# Patient Record
Sex: Male | Born: 1950 | Race: Black or African American | Hispanic: No | Marital: Single | Smoking: Former smoker
Health system: Southern US, Community
[De-identification: ages and names within clinical notes are randomized; demographics above are authoritative.]

## PROBLEM LIST (undated history)

## (undated) DIAGNOSIS — H269 Unspecified cataract: Secondary | ICD-10-CM

## (undated) DIAGNOSIS — T7840XA Allergy, unspecified, initial encounter: Secondary | ICD-10-CM

## (undated) DIAGNOSIS — M1990 Unspecified osteoarthritis, unspecified site: Secondary | ICD-10-CM

## (undated) DIAGNOSIS — K219 Gastro-esophageal reflux disease without esophagitis: Secondary | ICD-10-CM

## (undated) DIAGNOSIS — A159 Respiratory tuberculosis unspecified: Secondary | ICD-10-CM

## (undated) HISTORY — DX: Unspecified osteoarthritis, unspecified site: M19.90

## (undated) HISTORY — DX: Gastro-esophageal reflux disease without esophagitis: K21.9

## (undated) HISTORY — DX: Allergy, unspecified, initial encounter: T78.40XA

## (undated) HISTORY — PX: POLYPECTOMY: SHX149

## (undated) HISTORY — PX: OTHER SURGICAL HISTORY: SHX169

## (undated) HISTORY — DX: Unspecified cataract: H26.9

## (undated) HISTORY — DX: Respiratory tuberculosis unspecified: A15.9

## (undated) HISTORY — PX: COLONOSCOPY: SHX174

---

## 1979-07-22 DIAGNOSIS — A159 Respiratory tuberculosis unspecified: Secondary | ICD-10-CM

## 1979-07-22 HISTORY — DX: Respiratory tuberculosis unspecified: A15.9

## 1994-07-21 HISTORY — PX: OTHER SURGICAL HISTORY: SHX169

## 2002-06-11 ENCOUNTER — Emergency Department (HOSPITAL_COMMUNITY): Admission: EM | Admit: 2002-06-11 | Discharge: 2002-06-11 | Payer: Self-pay | Admitting: Emergency Medicine

## 2002-07-31 ENCOUNTER — Emergency Department (HOSPITAL_COMMUNITY): Admission: EM | Admit: 2002-07-31 | Discharge: 2002-07-31 | Payer: Self-pay

## 2005-12-11 ENCOUNTER — Encounter: Admission: RE | Admit: 2005-12-11 | Discharge: 2005-12-11 | Payer: Self-pay | Admitting: Emergency Medicine

## 2006-11-15 ENCOUNTER — Emergency Department (HOSPITAL_COMMUNITY): Admission: EM | Admit: 2006-11-15 | Discharge: 2006-11-15 | Payer: Self-pay | Admitting: Emergency Medicine

## 2006-11-16 ENCOUNTER — Emergency Department (HOSPITAL_COMMUNITY): Admission: EM | Admit: 2006-11-16 | Discharge: 2006-11-16 | Payer: Self-pay | Admitting: Emergency Medicine

## 2007-03-17 ENCOUNTER — Encounter: Admission: RE | Admit: 2007-03-17 | Discharge: 2007-03-17 | Payer: Self-pay | Admitting: Emergency Medicine

## 2007-04-02 ENCOUNTER — Encounter: Admission: RE | Admit: 2007-04-02 | Discharge: 2007-04-02 | Payer: Self-pay | Admitting: Emergency Medicine

## 2007-10-27 ENCOUNTER — Ambulatory Visit: Payer: Self-pay | Admitting: Internal Medicine

## 2007-11-09 ENCOUNTER — Ambulatory Visit: Payer: Self-pay | Admitting: Internal Medicine

## 2007-11-09 ENCOUNTER — Encounter: Payer: Self-pay | Admitting: Internal Medicine

## 2009-01-25 IMAGING — US US ABDOMEN COMPLETE
1 series · 14 of 25 positions shown · non-contrast
Comparison: None.

ABDOMEN ULTRASOUND:

CLINICAL DATA: Upper abdominal pain
TECHNIQUE: Complete abdominal ultrasound examination was performed including
evaluation of the liver, gallbladder, bile ducts, pancreas, kidneys, spleen,
IVC, and abdominal aorta.

[Series 1: us abdomen complete · 0.33mm/px · 14 of 62 slices shown]
[im 1/62]
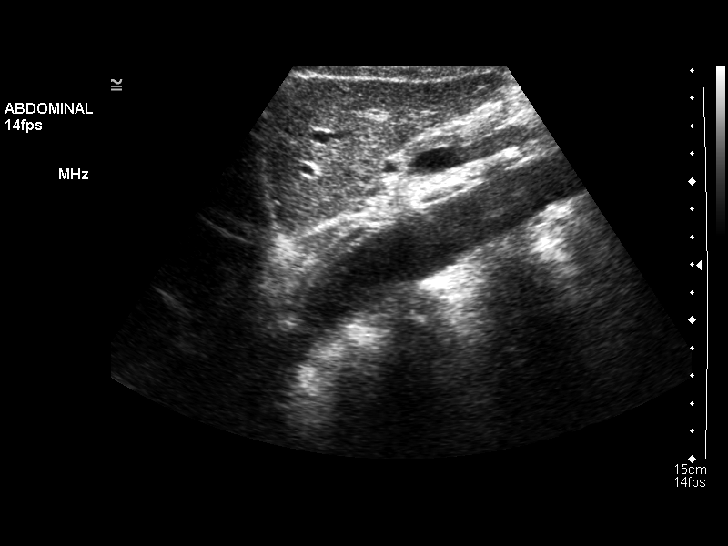
[im 6/62]
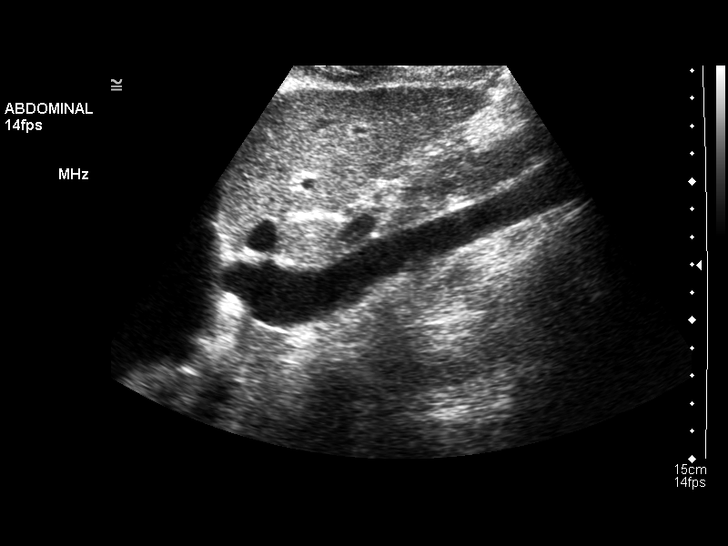
[im 11/62]
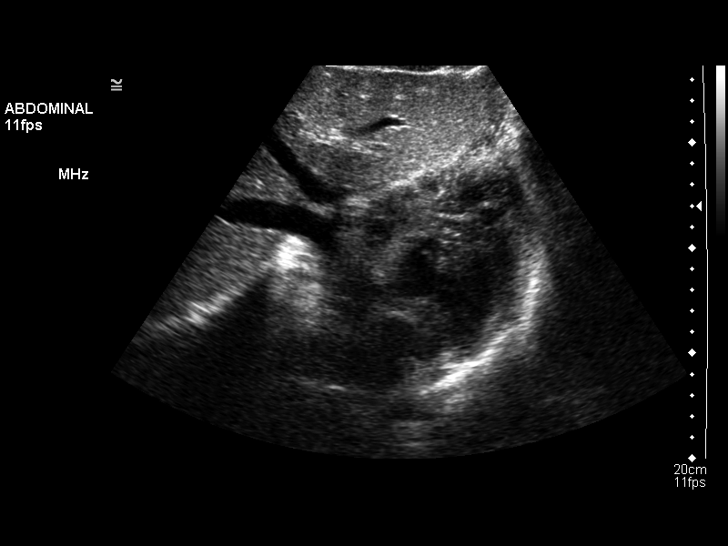
[im 16/62]
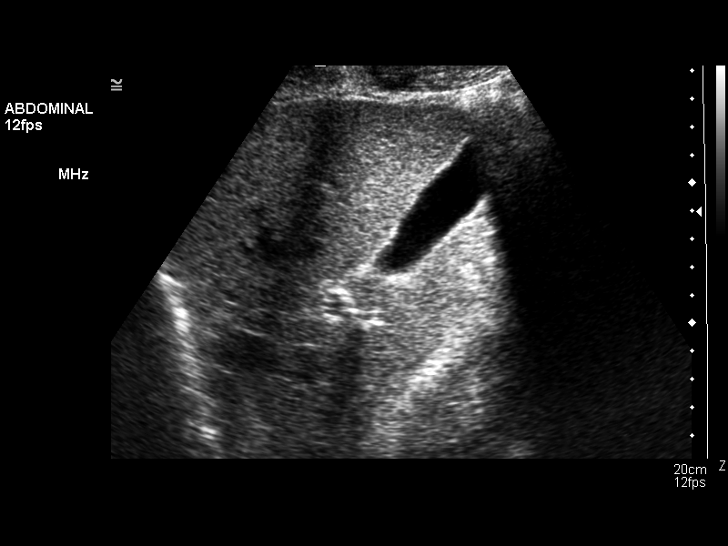
[im 21/62]
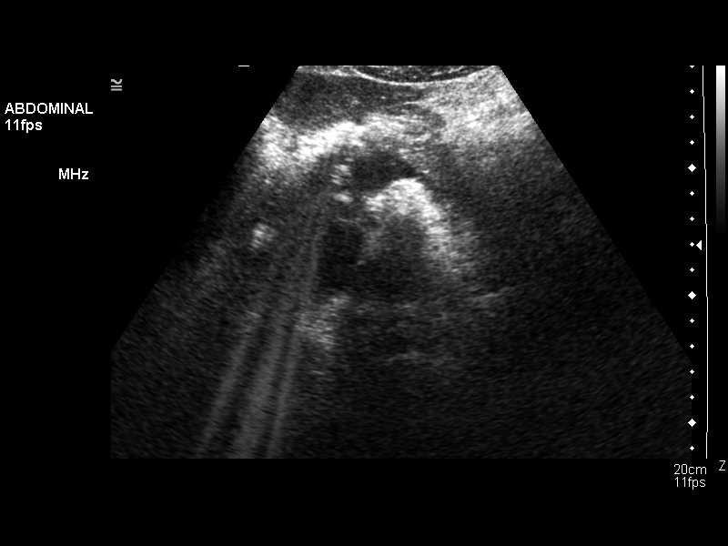
[im 23/62]
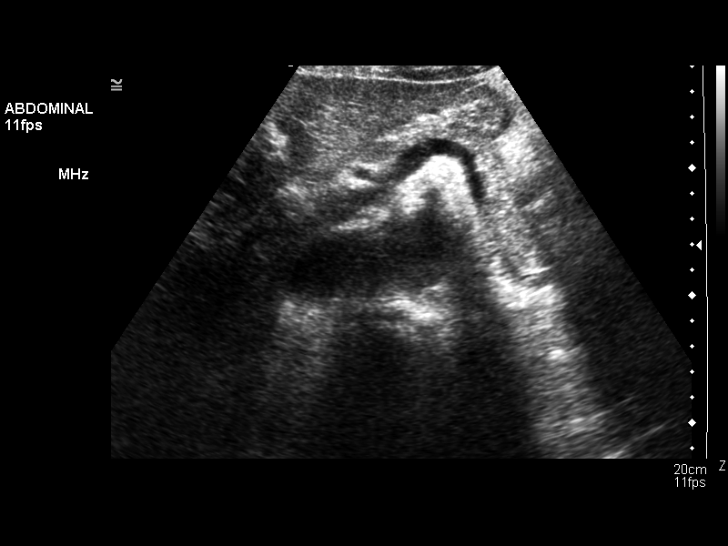
[im 28/62]
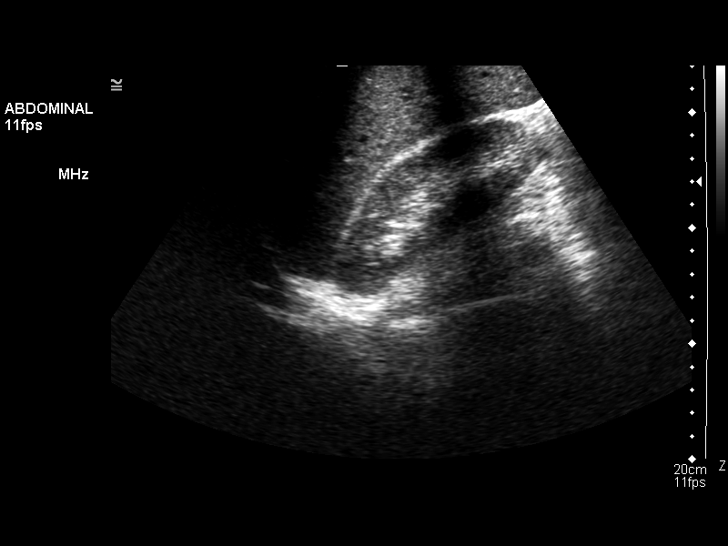
[im 34/62]
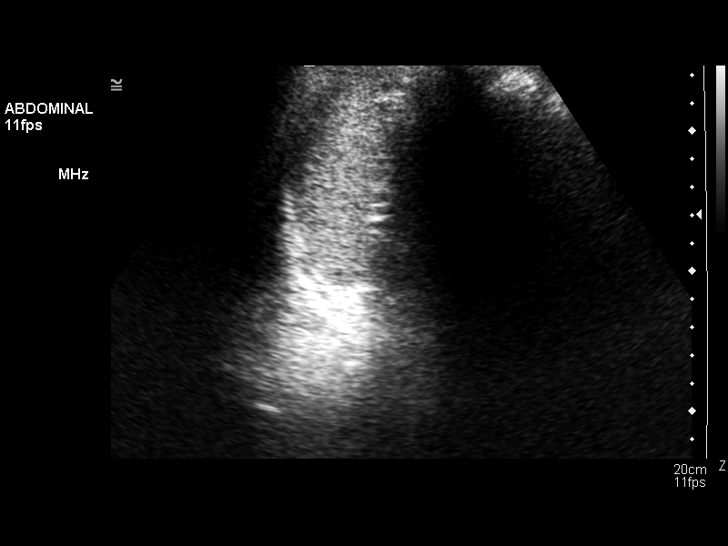
[im 39/62]
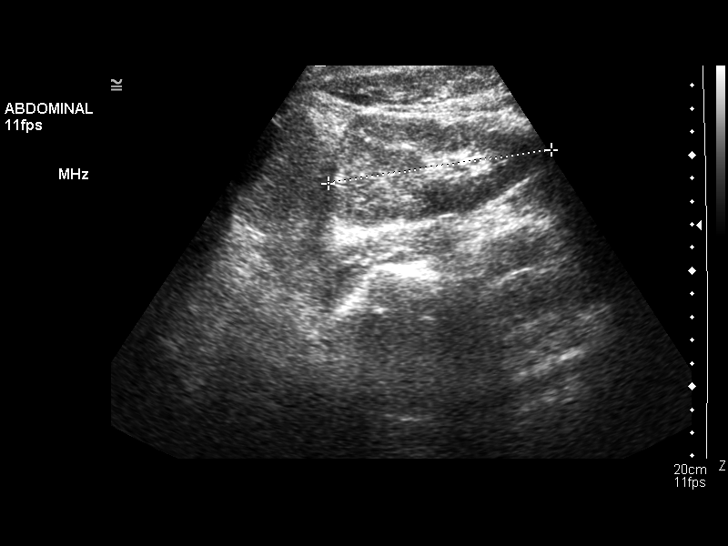
[im 41/62]
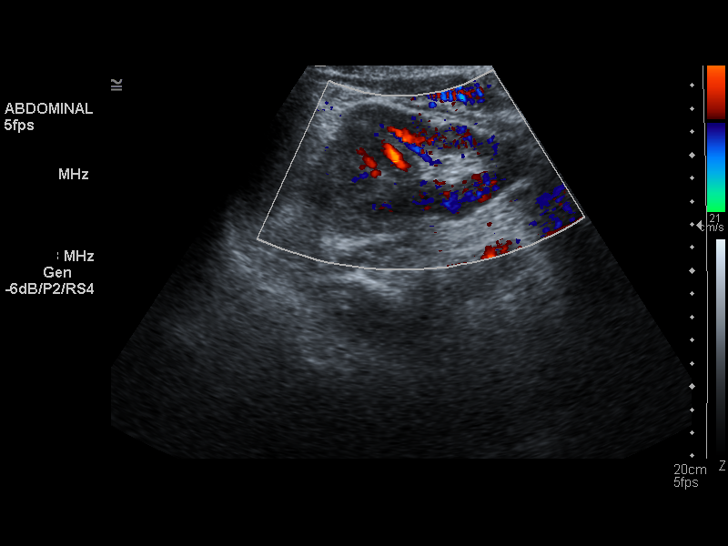
[im 46/62]
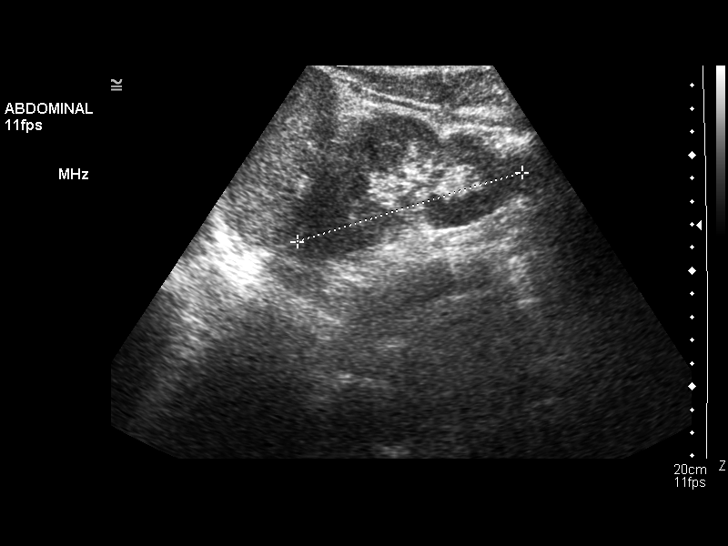
[im 51/62]
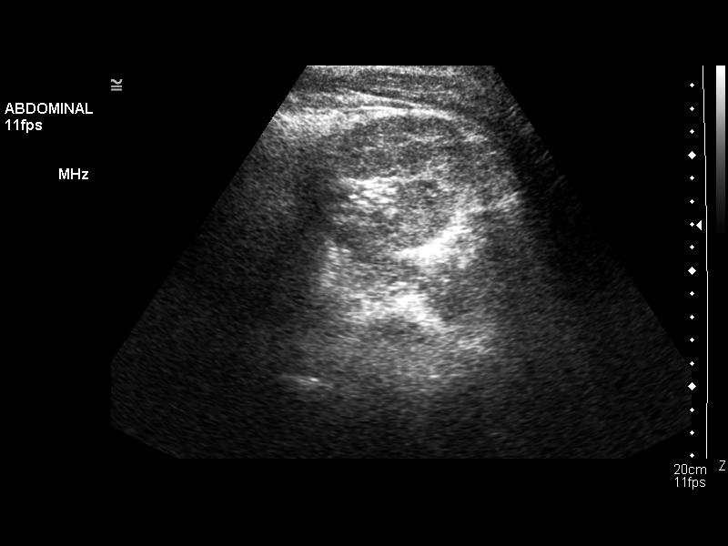
[im 56/62]
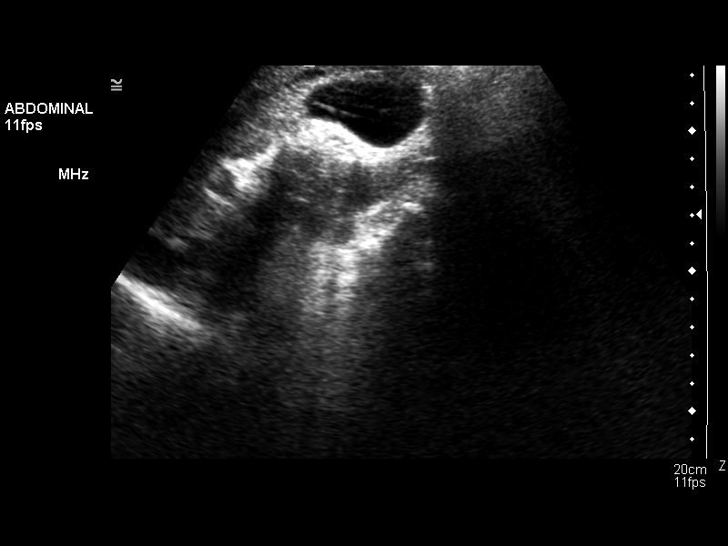
[im 62/62]
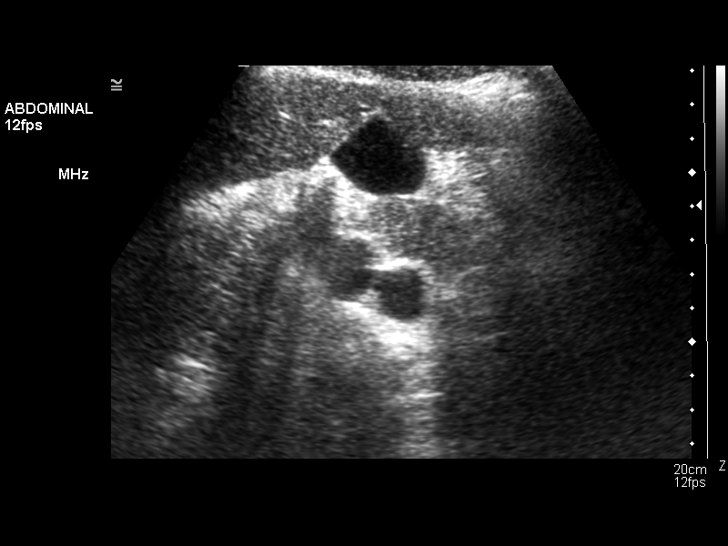

[14 of 25 positions shown; findings below may reference images not displayed]

FINDINGS: Gallbladder: Normal. Specifically, there is no evidence for gallstones,
gallbladder wall thickening or pericholecystic fluid.

Common Bile Duct:  Nondilated

Liver:  Normal

Inferior Vena Cava:  Normal

Pancreas:  Normal

Spleen:  Normal

Right Kidney:  11.0 cm in long axis.  Normal

Left Kidney:  10.2 cm in long axis.  Normal

Aorta:  No aneurysm
IMPRESSION: Normal abdominal ultrasound.

## 2009-02-10 IMAGING — RF DG UGI W/ HIGH DENSITY W/KUB
12 series · 12 of 12 positions shown · non-contrast
Comparison: none

CLINICAL DATA: Epigastric pain.
 UPPER GI WITH HIGH DENSITY WITH KUB:

[Series 1: run · 1 of 1 slices shown (1 of 11)]
[im 1/1]
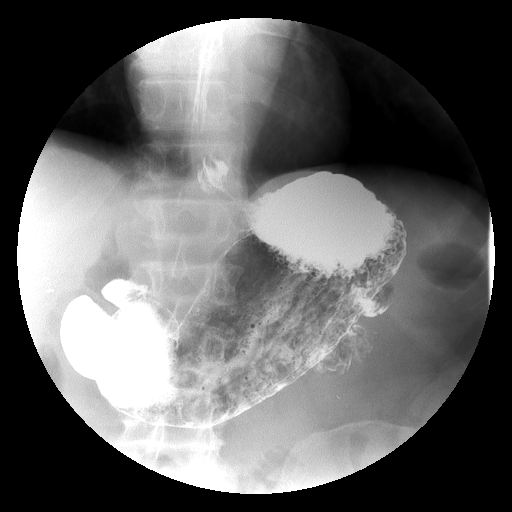

[Series 2: run · 1 of 1 slices shown (2 of 11)]
[im 1/1]
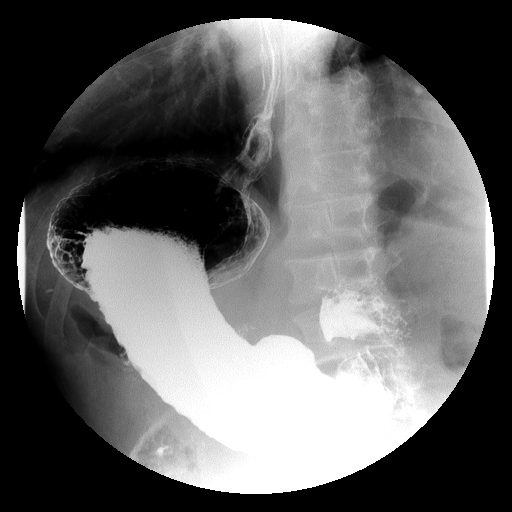

[Series 3: run · 1 of 1 slices shown (3 of 11)]
[im 1/1]
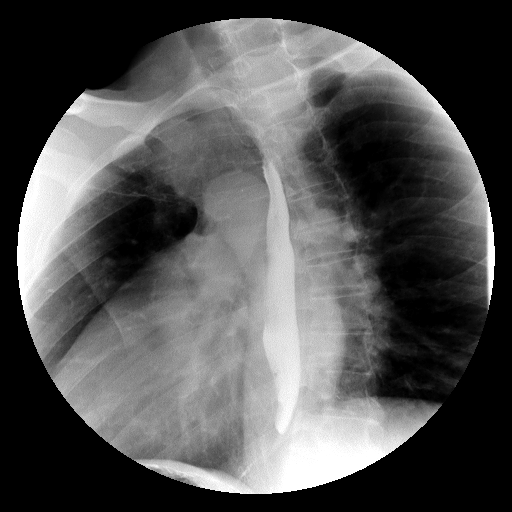

[Series 4: run · 1 of 1 slices shown (4 of 11)]
[im 1/1]
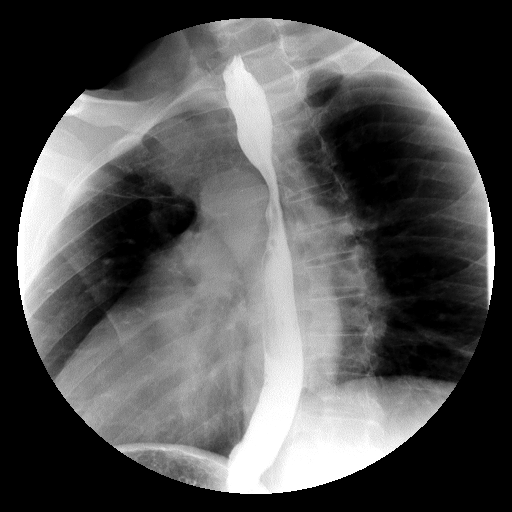

[Series 5: run · 1 of 1 slices shown (5 of 11)]
[im 1/1]
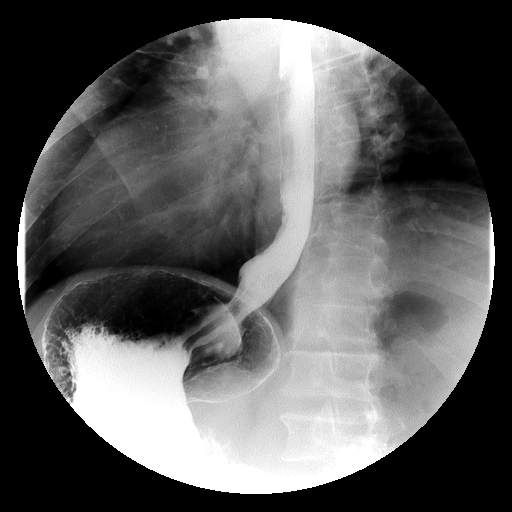

[Series 6: run · 1 of 1 slices shown (6 of 11)]
[im 1/1]
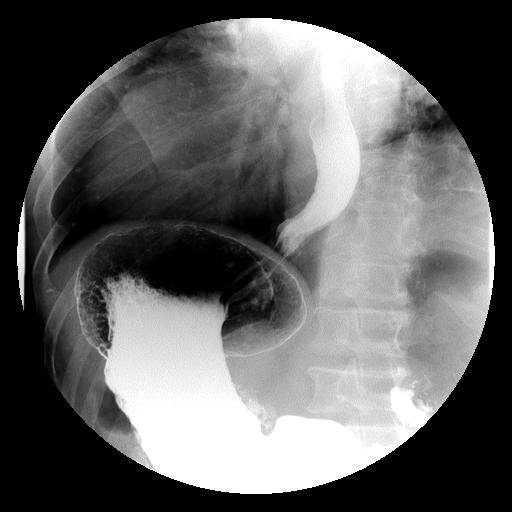

[Series 7: run · 1 of 1 slices shown (7 of 11)]
[im 1/1]
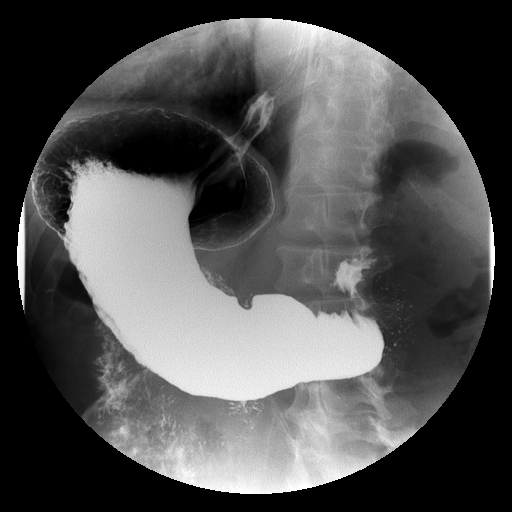

[Series 8: run · 1 of 1 slices shown (8 of 11)]
[im 1/1]
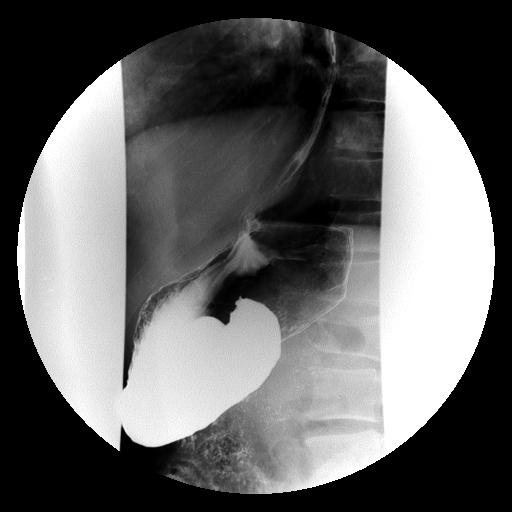

[Series 9: run · 1 of 1 slices shown (9 of 11)]
[im 1/1]
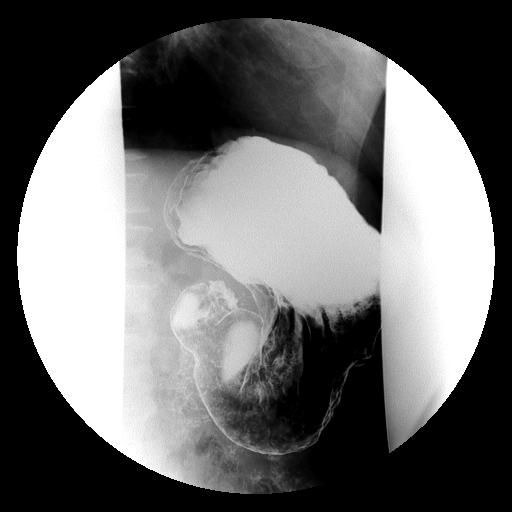

[Series 10: run · 1 of 1 slices shown (10 of 11)]
[im 1/1]
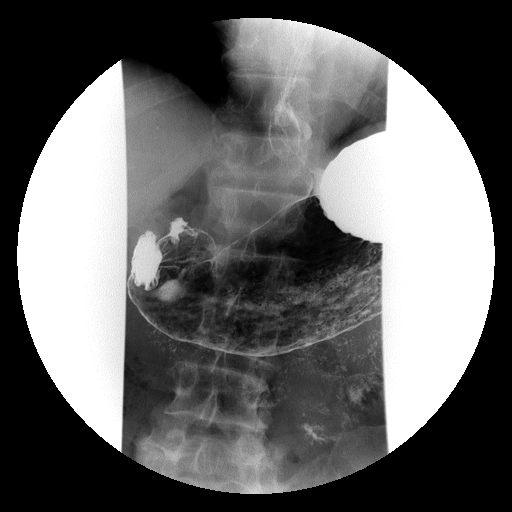

[Series 11: run · 1 of 1 slices shown (11 of 11)]
[im 1/1]
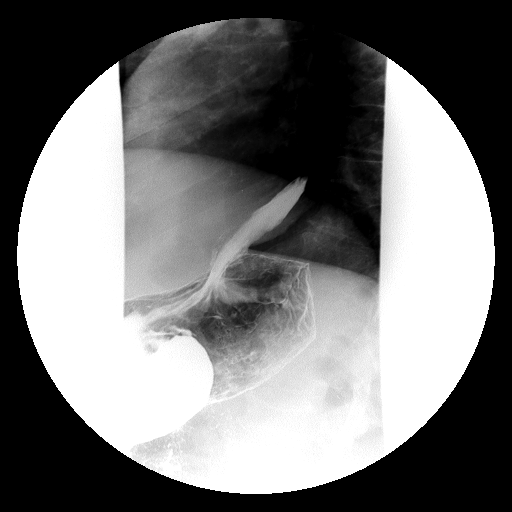

[Series 1001: view not recorded · 0.20mm/px · 1 of 1 slices shown]
[im 1/1]
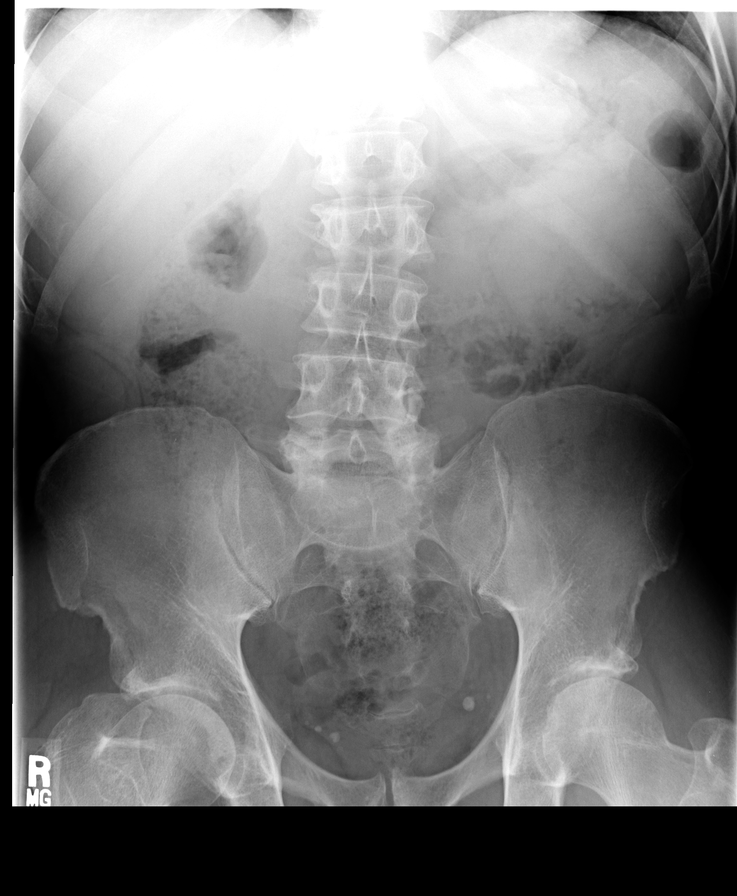

[12 of 12 positions shown; findings below may reference images not displayed]

FINDINGS: Preliminary scout film is negative.  Normal esophageal motility.  No stricture.  Minimal hiatal hernia.  Mild to moderate GE reflux is noted at fluoroscopy.  No gastric or duodenal mass, ulceration or mucosal fold thickening.
IMPRESSION: Small hiatal hernia and GE reflux.

## 2010-04-12 ENCOUNTER — Emergency Department (HOSPITAL_COMMUNITY): Admission: EM | Admit: 2010-04-12 | Discharge: 2010-04-12 | Payer: Self-pay | Admitting: Emergency Medicine

## 2010-05-14 ENCOUNTER — Emergency Department (HOSPITAL_COMMUNITY): Admission: EM | Admit: 2010-05-14 | Discharge: 2010-05-14 | Payer: Self-pay | Admitting: Emergency Medicine

## 2010-08-12 ENCOUNTER — Encounter: Payer: Self-pay | Admitting: Emergency Medicine

## 2012-12-02 ENCOUNTER — Encounter: Payer: Self-pay | Admitting: Internal Medicine

## 2013-12-28 ENCOUNTER — Encounter: Payer: Self-pay | Admitting: Internal Medicine

## 2014-04-22 ENCOUNTER — Encounter: Payer: Self-pay | Admitting: Internal Medicine

## 2015-08-29 ENCOUNTER — Encounter (HOSPITAL_COMMUNITY): Payer: Self-pay | Admitting: Emergency Medicine

## 2015-08-29 ENCOUNTER — Emergency Department (INDEPENDENT_AMBULATORY_CARE_PROVIDER_SITE_OTHER)
Admission: EM | Admit: 2015-08-29 | Discharge: 2015-08-29 | Disposition: A | Discharge status: Home / Self Care | Payer: PRIVATE HEALTH INSURANCE | Source: Home / Self Care | Attending: Emergency Medicine | Admitting: Emergency Medicine

## 2015-08-29 DIAGNOSIS — L0231 Cutaneous abscess of buttock: Secondary | ICD-10-CM | POA: Diagnosis not present

## 2015-08-29 MED ORDER — LIDOCAINE HCL 2 % IJ SOLN
INTRAMUSCULAR | Status: AC
  Filled 2015-08-29: qty 20

## 2015-08-29 MED ORDER — HYDROCODONE-ACETAMINOPHEN 5-325 MG PO TABS
1.0000 | ORAL_TABLET | ORAL | Status: DC | PRN
Start: 2015-08-29 — End: 2017-03-05

## 2015-08-29 NOTE — ED Notes (Signed)
History of boils.  Patient reports abscess to left buttocks.  Onset 4 days ago.

## 2015-08-29 NOTE — ED Provider Notes (Signed)
CSN: [id number]     Arrival date & time 08/29/15  1301 History   First MD Initiated Contact with Patient 08/29/15 1317     Chief Complaint  Patient presents with  . Abscess   (Consider location/radiation/quality/duration/timing/severity/associated sxs/prior Treatment) HPI Comments: 65 year old male complaining of a boil to his left buttock. Started approximately 3 days ago. He has noticed it is been draining. 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 Denies fevers or systemic symptoms.   History reviewed. No pertinent past medical history. Past Surgical History  Procedure Laterality Date  . Cataracts     No family history on file. Social History  Substance Use Topics  . Smoking status: Never Smoker   . Smokeless tobacco: None  . Alcohol Use: No    Review of Systems  Constitutional: Negative.   Skin: Positive for wound.       Abscess to the left medial buttock as per history of present illness  All other systems reviewed and are negative.   Allergies  Review of patient's allergies indicates no known allergies.  Home Medications   Prior to Admission medications   Medication Sig Start Date End Date Taking? Authorizing Provider  HYDROcodone-acetaminophen (NORCO/VICODIN) 5-325 MG tablet Take 1 tablet by mouth every 4 (four) hours as needed. 08/29/15   Janne Napoleon, NP   Meds Ordered and Administered this Visit  Medications - No data to display  BP 119/81 mmHg  Pulse 107  Temp(Src) 97.8 F (36.6 C) (Oral)  Resp 16  SpO2 98% No data found.   Physical Exam  Constitutional: He appears well-developed and well-nourished. No distress.  Neck: Normal range of motion. Neck supple.  Cardiovascular: Normal rate.   Pulmonary/Chest: Effort normal. No respiratory distress.  Neurological: He is alert. No cranial nerve deficit. He exhibits normal muscle tone.  Skin: Skin is warm and dry.  Draining abscess to the right medial buttock  near the perineum. Does not include the anus. There is purulent drainage. Positive for tenderness and induration.  Psychiatric: He has a normal mood and affect.  Nursing note and vitals reviewed.   ED Course  .Marland KitchenIncision and Drainage Date/Time: 08/29/2015 2:05 PM Performed by: Marcha Dutton, Aadyn Buchheit Authorized by: Melony Overly Consent: Verbal consent obtained. Risks and benefits: risks, benefits and alternatives were discussed Consent given by: patient Patient understanding: patient states understanding of the procedure being performed Patient identity confirmed: verbally with patient Type: abscess Body area: lower extremity Location details: right buttock Anesthesia: local infiltration Local anesthetic: lidocaine 2% without epinephrine Anesthetic total: 7 ml Patient sedated: no Scalpel size: 11 Incision type: single with marsupialization Incision depth: subcutaneous Complexity: simple Drainage: purulent and  bloody Drainage amount: scant Wound treatment: drain placed Packing material: 1/4 in iodoform gauze Patient tolerance: Patient tolerated the procedure well with no immediate complications Comments: Bulky dressing applied.   (including critical care time)  Labs Review Labs Reviewed - No data to display  Imaging Review No results found.   Visual Acuity Review  Right Eye Distance:   Left Eye Distance:   Bilateral Distance:    Right Eye Near:   Left Eye Near:    Bilateral Near:         MDM   1. Abscess of buttock, right    incision and drainage performed. Warm compresses often Norco for pain, may cause drowsiness. Should not drive or operate dangerous machinery while taking Return 2 days for wound chk.  Janne Napoleon, NP 08/29/15 1407

## 2015-08-29 NOTE — Discharge Instructions (Signed)
Abscess Warm compresses often Norco for pain, may cause drowsiness. Should not drive or operate dangerous machinery while taking An abscess is an infected area that contains a collection of pus and debris.It can occur in almost any part of the body. An abscess is also known as a furuncle or boil. CAUSES  An abscess occurs when tissue gets infected. This can occur from blockage of oil or sweat glands, infection of hair follicles, or a minor injury to the skin. As the body tries to fight the infection, pus collects in the area and creates pressure under the skin. This pressure causes pain. People with weakened immune systems have difficulty fighting infections and get certain abscesses more often.  SYMPTOMS Usually an abscess develops on the skin and becomes a painful mass that is red, warm, and tender. If the abscess forms under the skin, you may feel a moveable soft area under the skin. Some abscesses break open (rupture) on their own, but most will continue to get worse without care. The infection can spread deeper into the body and eventually into the bloodstream, causing you to feel ill.  DIAGNOSIS  Your caregiver will take your medical history and perform a physical exam. A sample of fluid may also be taken from the abscess to determine what is causing your infection. TREATMENT  Your caregiver may prescribe antibiotic medicines to fight the infection. However, taking antibiotics alone usually does not cure an abscess. Your caregiver may need to make a small cut (incision) in the abscess to drain the pus. In some cases, gauze is packed into the abscess to reduce pain and to continue draining the area. HOME CARE INSTRUCTIONS   Only take over-the-counter or prescription medicines for pain, discomfort, or fever as directed by your caregiver.  If you were prescribed antibiotics, take them as directed. Finish them even if you start to feel better.  If gauze is used, follow your caregiver's  directions for changing the gauze.  To avoid spreading the infection:  Keep your draining abscess covered with a bandage.  Wash your hands well.  Do not share personal care items, towels, or whirlpools with others.  Avoid skin contact with others.  Keep your skin and clothes clean around the abscess.  Keep all follow-up appointments as directed by your caregiver. SEEK MEDICAL CARE IF:   You have increased pain, swelling, redness, fluid drainage, or bleeding.  You have muscle aches, chills, or a general ill feeling.  You have a fever. MAKE SURE YOU:   Understand these instructions.  Will watch your condition.  Will get help right away if you are not doing well or get worse.   This information is not intended to replace advice given to you by your health care provider. Make sure you discuss any questions you have with your health care provider.   Document Released: 04/16/2005 Document Revised: 01/06/2012 Document Reviewed: 09/19/2011 Elsevier Interactive Patient Education 2016 [employer]  Incision and Drainage Incision and drainage is a procedure in which a sac-like structure (cystic structure) is opened and drained. The area to be drained usually contains material such as pus, fluid, or blood.  LET YOUR CAREGIVER KNOW ABOUT:   Allergies to medicine.  Medicines taken, including vitamins, herbs, eyedrops, over-the-counter medicines, and creams.  Use of steroids (by mouth or creams).  Previous problems with anesthetics or numbing medicines.  History of bleeding problems or blood clots.  Previous surgery.  Other health problems, including diabetes and kidney problems.  Possibility of pregnancy,  if this applies. RISKS AND COMPLICATIONS  Pain.  Bleeding.  Scarring.  Infection. BEFORE THE PROCEDURE  You may need to have an ultrasound or other imaging tests to see how large or deep your cystic structure is. Blood tests may also be used to determine if you  have an infection or how severe the infection is. You may need to have a tetanus shot. PROCEDURE  The affected area is cleaned with a cleaning fluid. The cyst area will then be numbed with a medicine (local anesthetic). A small incision will be made in the cystic structure. A syringe or catheter may be used to drain the contents of the cystic structure, or the contents may be squeezed out. The area will then be flushed with a cleansing solution. After cleansing the area, it is often gently packed with a gauze or another wound dressing. Once it is packed, it will be covered with gauze and tape or some other type of wound dressing. AFTER THE PROCEDURE   Often, you will be allowed to go home right after the procedure.  You may be given antibiotic medicine to prevent or heal an infection.  If the area was packed with gauze or some other wound dressing, you will likely need to come back in 1 to 2 days to get it removed.  The area should heal in about 14 days.   This information is not intended to replace advice given to you by your health care provider. Make sure you discuss any questions you have with your health care provider.   Document Released: 12/31/2000 Document Revised: 01/06/2012 Document Reviewed: 09/01/2011 Elsevier Interactive Patient Education [employer].

## 2015-08-31 ENCOUNTER — Emergency Department (INDEPENDENT_AMBULATORY_CARE_PROVIDER_SITE_OTHER)
Admission: EM | Admit: 2015-08-31 | Discharge: 2015-08-31 | Disposition: A | Discharge status: Home / Self Care | Payer: PRIVATE HEALTH INSURANCE | Source: Home / Self Care | Attending: Emergency Medicine | Admitting: Emergency Medicine

## 2015-08-31 ENCOUNTER — Encounter (HOSPITAL_COMMUNITY): Payer: Self-pay | Admitting: Emergency Medicine

## 2015-08-31 DIAGNOSIS — Z5189 Encounter for other specified aftercare: Secondary | ICD-10-CM

## 2015-08-31 DIAGNOSIS — Z4801 Encounter for change or removal of surgical wound dressing: Secondary | ICD-10-CM

## 2015-08-31 MED ORDER — SULFAMETHOXAZOLE-TRIMETHOPRIM 800-160 MG PO TABS: 1.0000 | ORAL_TABLET | Freq: Two times a day (BID) | ORAL | Status: AC

## 2015-08-31 NOTE — ED Notes (Signed)
The patient presented to the [facility] with a complaint of a follow up on an abscess that was initially evaluated on 08/29/2015.

## 2015-08-31 NOTE — ED Provider Notes (Signed)
CSN: [id number]     Arrival date & time 08/31/15  1549 History   First MD Initiated Contact with Patient 08/31/15 1702     Chief Complaint  Patient presents with  . Follow-up  . Abscess   (Consider location/radiation/quality/duration/timing/severity/associated sxs/prior Treatment) HPI  He is a 65 year old man here for follow-up of abscess. He was seen here 2 days ago and a left buttock abscess was drained. Packing was placed. He has left the bandage in place. He states it is improving. No fevers or chills. He is not currently on antibiotics.  History reviewed. No pertinent past medical history. Past Surgical History  Procedure Laterality Date  . Cataracts     History reviewed. No pertinent family history. Social History  Substance Use Topics  . Smoking status: Never Smoker   . Smokeless tobacco: None  . Alcohol Use: No    Review of Systems As in history of present illness Allergies  Review of patient's allergies indicates no known allergies.  Home Medications   Prior to Admission medications   Medication Sig Start Date End Date Taking? Authorizing Provider  HYDROcodone-acetaminophen (NORCO/VICODIN) 5-325 MG tablet Take 1 tablet by mouth every 4 (four) hours as needed. 08/29/15  Yes Janne Napoleon, NP  sulfamethoxazole-trimethoprim (BACTRIM DS,SEPTRA DS) 800-160 MG tablet Take 1 tablet by mouth 2 (two) times daily. 08/31/15 09/07/15  Melony Overly, MD   Meds Ordered and Administered this Visit  Medications - No data to display  BP 138/70 mmHg  Pulse 71  Temp(Src) 97.8 F (36.6 C) (Oral)  Resp 16  SpO2 100% No data found.   Physical Exam  Constitutional: He is oriented to person, place, and time. He appears well-developed and well-nourished. No distress.  Cardiovascular: Normal rate.   Pulmonary/Chest: Effort normal.  Neurological: He is alert and oriented to person, place, and time.  Skin:  Healing abscess at the posterior aspect of the left scrotum. Very minimal drainage  seen today. There is some surrounding induration and tenderness.    ED Course  Procedures (including critical care time)  Labs Review Labs Reviewed - No data to display  Imaging Review No results found.    MDM   1. Wound check, abscess    No need to repack the wound. We'll treat with 7 days of Bactrim for the induration. Follow-up as needed.    Melony Overly, MD 08/31/15 [phone]

## 2015-08-31 NOTE — Discharge Instructions (Signed)
The abscess is healing well. You may see a small amount of drainage for the next day or 2. Please take Bactrim twice a day for the next 7 days. Follow-up as needed.

## 2017-01-01 ENCOUNTER — Encounter: Payer: Self-pay | Admitting: Internal Medicine

## 2017-02-25 ENCOUNTER — Ambulatory Visit (AMBULATORY_SURGERY_CENTER): Payer: Self-pay | Admitting: *Deleted

## 2017-02-25 VITALS — Ht 65.0 in | Wt 162.0 lb

## 2017-02-25 DIAGNOSIS — Z86010 Personal history of colonic polyps: Secondary | ICD-10-CM

## 2017-02-25 MED ORDER — NA SULFATE-K SULFATE-MG SULF 17.5-3.13-1.6 GM/177ML PO SOLN: 1.0000 | Freq: Once | ORAL | 0 refills | Status: AC

## 2017-02-25 NOTE — Progress Notes (Signed)
No egg or soy allergy known to patient  No issues with past sedation with any surgeries  or procedures, no intubation problems  No diet pills per patient No home 02 use per patient  No blood thinners per patient  Pt denies issues with constipation  No A fib or A flutter  EMMI video sent to pt's e mail  

## 2017-02-26 ENCOUNTER — Encounter: Payer: Self-pay | Admitting: Internal Medicine

## 2017-03-05 ENCOUNTER — Encounter: Payer: Self-pay | Admitting: Internal Medicine

## 2017-03-05 ENCOUNTER — Ambulatory Visit (AMBULATORY_SURGERY_CENTER): Payer: PRIVATE HEALTH INSURANCE | Admitting: Internal Medicine

## 2017-03-05 VITALS — BP 127/80 | HR 51 | Temp 98.7°F | Resp 14 | Ht 65.0 in | Wt 162.0 lb

## 2017-03-05 DIAGNOSIS — D122 Benign neoplasm of ascending colon: Secondary | ICD-10-CM

## 2017-03-05 DIAGNOSIS — Z86010 Personal history of colonic polyps: Secondary | ICD-10-CM

## 2017-03-05 DIAGNOSIS — D126 Benign neoplasm of colon, unspecified: Secondary | ICD-10-CM | POA: Diagnosis not present

## 2017-03-05 MED ORDER — SODIUM CHLORIDE 0.9 % IV SOLN: 500.0000 mL | INTRAVENOUS | Status: AC

## 2017-03-05 NOTE — Op Note (Signed)
[name] Procedure Date: 03/05/2017 3:05 PM MRN: [id number] Endoscopist: Docia Chuck. Henrene Pastor , MD Age: 66 Referring MD:  Date of Birth: 06-Jul-1951 Gender: Male Account #: [id number] Procedure:                Colonoscopy, with snare polypectomy x 1 Indications:              High risk colon cancer surveillance: Personal                            history of non-advanced adenoma. Prior examinations                            2004 and 2009 Medicines:                Monitored Anesthesia Care Procedure:                Pre-Anesthesia Assessment:                           - Prior to the procedure, a History and Physical                            was performed, and patient medications and                            allergies were reviewed. The patient's tolerance of                            previous anesthesia was also reviewed. The risks                            and benefits of the procedure and the sedation                            options and risks were discussed with the patient.                            All questions were answered, and informed consent                            was obtained. Prior Anticoagulants: The patient has                            taken no previous anticoagulant or antiplatelet                            agents. ASA Grade Assessment: II - A patient with                            mild systemic disease. After reviewing the risks                            and benefits, the patient was deemed in  satisfactory condition to undergo the procedure.                           After obtaining informed consent, the colonoscope                            was passed under direct vision. Throughout the                            procedure, the patient's blood pressure, pulse, and                            oxygen saturations were monitored continuously. The                            Model CF-HQ190L  [id number]) scope was introduced                            through the anus and advanced to the the cecum,                            identified by appendiceal orifice and ileocecal                            valve. The ileocecal valve, appendiceal orifice,                            and rectum were photographed. The quality of the                            bowel preparation was excellent. The colonoscopy                            was performed without difficulty. The patient                            tolerated the procedure well. The bowel preparation                            used was SUPREP. Scope In: 3:19:53 PM Scope Out: 3:32:46 PM Scope Withdrawal Time: 0 hours 9 minutes 28 seconds  Total Procedure Duration: 0 hours 12 minutes 53 seconds  Findings:                 A 1 mm polyp was found in the ascending colon. The                            polyp was removed with a cold snare. Resection and                            retrieval were complete.                           Internal hemorrhoids were found during retroflexion.  The exam was otherwise without abnormality on                            direct and retroflexion views. Complications:            No immediate complications. Estimated blood loss:                            None. Estimated Blood Loss:     Estimated blood loss: none. Impression:               - One 1 mm polyp in the ascending colon, removed                            with a cold snare. Resected and retrieved.                           - Internal hemorrhoids.                           - The examination was otherwise normal on direct                            and retroflexion views. Recommendation:           - Repeat colonoscopy in 5-10 years for surveillance.                           - Patient has a contact number available for                            emergencies. The signs and symptoms of potential                             delayed complications were discussed with the                            patient. Return to normal activities tomorrow.                            Written discharge instructions were provided to the                            patient.                           - Resume previous diet.                           - Continue present medications.                           - Await pathology results. Docia Chuck. Henrene Pastor, MD 03/05/2017 3:37:23 PM This report has been signed electronically.

## 2017-03-05 NOTE — Progress Notes (Signed)
Report to PACU, RN, vss, BBS= Clear.  

## 2017-03-05 NOTE — Progress Notes (Signed)
Called to room to assist during endoscopic procedure.  Patient ID and intended procedure confirmed with present staff. Received instructions for my participation in the procedure from the performing physician.  

## 2017-03-05 NOTE — Progress Notes (Signed)
Pt's states no medical or surgical changes since previsit or office visit. 

## 2017-03-05 NOTE — Patient Instructions (Signed)
YOU HAD AN ENDOSCOPIC PROCEDURE TODAY AT THE [facility]:   Refer to the procedure report that was given to you for any specific questions about what was found during the examination.  If the procedure report does not answer your questions, please call your gastroenterologist to clarify.  If you requested that your care partner not be given the details of your procedure findings, then the procedure report has been included in a sealed envelope for you to review at your convenience later.  YOU SHOULD EXPECT: Some feelings of bloating in the abdomen. Passage of more gas than usual.  Walking can help get rid of the air that was put into your GI tract during the procedure and reduce the bloating. If you had a lower endoscopy (such as a colonoscopy or flexible sigmoidoscopy) you may notice spotting of blood in your stool or on the toilet paper. If you underwent a bowel prep for your procedure, you may not have a normal bowel movement for a few days.  Please Note:  You might notice some irritation and congestion in your nose or some drainage.  This is from the oxygen used during your procedure.  There is no need for concern and it should clear up in a day or so.  SYMPTOMS TO REPORT IMMEDIATELY:   Following lower endoscopy (colonoscopy or flexible sigmoidoscopy):  Excessive amounts of blood in the stool  Significant tenderness or worsening of abdominal pains  Swelling of the abdomen that is new, acute  Fever of 100F or higher   For urgent or emergent issues, a gastroenterologist can be reached at any hour by calling [phone].   DIET:  We do recommend a small meal at first, but then you may proceed to your regular diet.  Drink plenty of fluids but you should avoid alcoholic beverages for 24 hours.  ACTIVITY:  You should plan to take it easy for the rest of today and you should NOT DRIVE or use heavy machinery until tomorrow (because of the sedation medicines used during the test).     FOLLOW UP: Our staff will call the number listed on your records the next business day following your procedure to check on you and address any questions or concerns that you may have regarding the information given to you following your procedure. If we do not reach you, we will leave a message.  However, if you are feeling well and you are not experiencing any problems, there is no need to return our call.  We will assume that you have returned to your regular daily activities without incident.  If any biopsies were taken you will be contacted by phone or by letter within the next 1-3 weeks.  Please call us at [phone] if you have not heard about the biopsies in 3 weeks.    SIGNATURES/CONFIDENTIALITY: You and/or your care partner have signed paperwork which will be entered into your electronic medical record.  These signatures attest to the fact that that the information above on your After Visit Summary has been reviewed and is understood.  Full responsibility of the confidentiality of this discharge information lies with you and/or your care-partner.    Handouts were given to your care partner on polyps and hemorrhoids. You may resume your current medications today. Await biopsy results. Please call if any questions or concerns.   

## 2017-03-06 ENCOUNTER — Telehealth: Payer: Self-pay | Admitting: *Deleted

## 2017-03-06 NOTE — Telephone Encounter (Signed)
  Follow up Call-  Call back number 03/05/2017  Post procedure Call Back phone  # [phone]  Permission to leave phone message Yes  Some recent data might be hidden     Patient questions:  Do you have a fever, pain , or abdominal swelling? No. Pain Score  0 *  Have you tolerated food without any problems? Yes.    Have you been able to return to your normal activities? Yes.    Do you have any questions about your discharge instructions: Diet   No. Medications  No. Follow up visit  No.  Do you have questions or concerns about your Care? No.  Actions: * If pain score is 4 or above: No action needed, pain <4.

## 2017-03-11 ENCOUNTER — Encounter: Payer: Self-pay | Admitting: Internal Medicine

## 2019-02-08 ENCOUNTER — Other Ambulatory Visit: Payer: Self-pay | Admitting: *Deleted

## 2019-02-08 DIAGNOSIS — Z20822 Contact with and (suspected) exposure to covid-19: Secondary | ICD-10-CM

## 2021-07-30 DIAGNOSIS — E7800 Pure hypercholesterolemia, unspecified: Secondary | ICD-10-CM | POA: Diagnosis not present

## 2021-07-30 DIAGNOSIS — R7301 Impaired fasting glucose: Secondary | ICD-10-CM | POA: Diagnosis not present

## 2021-07-30 DIAGNOSIS — Z125 Encounter for screening for malignant neoplasm of prostate: Secondary | ICD-10-CM | POA: Diagnosis not present

## 2021-08-05 DIAGNOSIS — Z1339 Encounter for screening examination for other mental health and behavioral disorders: Secondary | ICD-10-CM | POA: Diagnosis not present

## 2021-08-05 DIAGNOSIS — Z0000 Encounter for general adult medical examination without abnormal findings: Secondary | ICD-10-CM | POA: Diagnosis not present

## 2021-08-05 DIAGNOSIS — R7301 Impaired fasting glucose: Secondary | ICD-10-CM | POA: Diagnosis not present

## 2021-08-05 DIAGNOSIS — Z1331 Encounter for screening for depression: Secondary | ICD-10-CM | POA: Diagnosis not present

## 2022-02-26 ENCOUNTER — Ambulatory Visit: Payer: BC Managed Care – PPO | Admitting: Podiatry

## 2022-02-26 DIAGNOSIS — Z79899 Other long term (current) drug therapy: Secondary | ICD-10-CM

## 2022-02-26 DIAGNOSIS — B351 Tinea unguium: Secondary | ICD-10-CM

## 2022-02-26 NOTE — Progress Notes (Signed)
  Subjective:  Patient ID: Brandon Strong, male    DOB: March 25, 1951,  MRN: [id number]  Chief Complaint  Patient presents with   Nail Problem    71 y.o. male presents with the above complaint.  Patient presenting thickened elongated dystrophic mycotic toenails x10.  Mild pain on palpation.  Patient would like to discuss treatment options for it.  He is tried over-the-counter medication none of which has helped.  Pain scale is 1 out of 10 hurts with ambulation.   Review of Systems: Negative except as noted in the HPI. Denies N/V/F/Ch.  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    Current Outpatient Medications:    fluticasone (FLONASE) 50 MCG/ACT nasal spray, Place 1 spray into both nostrils daily., Disp: , Rfl:    loratadine (CLARITIN) 10 MG tablet, Take 10 mg by mouth daily., Disp: , Rfl:    omeprazole (PRILOSEC) 20 MG capsule, Take 20 mg by mouth daily., Disp: , Rfl:    OVER THE COUNTER MEDICATION, [employer] supplement daily, Disp: , Rfl:    tadalafil (CIALIS) 20 MG tablet, Take 20 mg by mouth daily as needed for erectile dysfunction., Disp: , Rfl:   Current Facility-Administered Medications:    0.9 %  sodium chloride infusion, 500 mL, Intravenous, Continuous, Irene Shipper, MD  Social History   Tobacco Use  Smoking Status Former   Types: Cigarettes   Quit date: 07/22/1987   Years since quitting: 34.6  Smokeless Tobacco Never    No Known Allergies Objective:  There were no vitals filed for this visit. There is no height or weight on file to calculate BMI. Constitutional Well developed. Well nourished.  Vascular Dorsalis pedis pulses palpable bilaterally. Posterior tibial pulses palpable bilaterally. Capillary refill normal to all digits.  No cyanosis or clubbing noted. Pedal hair growth normal.  Neurologic  Normal speech. Oriented to person, place, and time. Epicritic sensation to light touch grossly present bilaterally.  Dermatologic Nails thickened elongated dystrophic mycotic toenails x10 mild pain on palpation Skin within normal limits  Orthopedic: Normal joint ROM without pain or crepitus bilaterally. No visible deformities. No bony tenderness.   Radiographs: None Assessment:   1. Long-term use of high-risk medication   2. Onychomycosis due to dermatophyte   3. Nail fungus    Plan:  Patient was evaluated and treated and all questions answered.  Bilateral hallux onychomycosis -Educated the patient on the etiology of onychomycosis and various treatment options associated with improving the fungal load.  I explained to the patient that there is 3 treatment options available to treat the onychomycosis including topical, p.o., laser treatment.  Patient elected to undergo p.o. options with Lamisil/terbinafine therapy.  In order for me to start the medication therapy, I explained to the patient the importance of evaluating the liver and obtaining the liver function test.  Once the liver function test comes back normal I will start him on 29-monthcourse of Lamisil therapy.  Patient understood all risk and would like to proceed with Lamisil therapy.  I have asked the patient to immediately stop the Lamisil therapy if she has any reactions to it and call the office or go to the emergency room right away.  Patient states understanding   No follow-ups on file.

## 2022-03-12 LAB — HEPATIC FUNCTION PANEL
AG Ratio: 1.5 (calc) (ref 1.0–2.5)
ALT: 33 U/L (ref 9–46)
AST: 27 U/L (ref 10–35)
Albumin: 4.4 g/dL (ref 3.6–5.1)
Alkaline phosphatase (APISO): 52 U/L (ref 35–144)
Bilirubin, Direct: 0.1 mg/dL (ref 0.0–0.2)
Globulin: 2.9 g/dL (calc) (ref 1.9–3.7)
Indirect Bilirubin: 0.3 mg/dL (calc) (ref 0.2–1.2)
Total Bilirubin: 0.4 mg/dL (ref 0.2–1.2)
Total Protein: 7.3 g/dL (ref 6.1–8.1)

## 2022-03-12 LAB — SPECIMEN COMPROMISED

## 2022-03-12 MED ORDER — TERBINAFINE HCL 250 MG PO TABS: 250.0000 mg | ORAL_TABLET | Freq: Every day | ORAL | 0 refills | Status: AC

## 2022-03-12 NOTE — Addendum Note (Signed)
Addended by: Boneta Lucks on: 03/12/2022 08:08 AM   Modules accepted: Orders

## 2022-08-14 ENCOUNTER — Other Ambulatory Visit (HOSPITAL_BASED_OUTPATIENT_CLINIC_OR_DEPARTMENT_OTHER): Payer: Self-pay

## 2023-04-14 ENCOUNTER — Other Ambulatory Visit: Payer: Self-pay | Admitting: Podiatry

## 2023-04-14 DIAGNOSIS — Z79899 Other long term (current) drug therapy: Secondary | ICD-10-CM
